# Patient Record
Sex: Female | Born: 1935 | Race: White | Hispanic: No
Health system: Southern US, Community
[De-identification: ages and names within clinical notes are randomized; demographics above are authoritative.]

---

## 2005-06-08 ENCOUNTER — Ambulatory Visit: Payer: Self-pay | Admitting: Family Medicine

## 2005-06-29 ENCOUNTER — Ambulatory Visit: Payer: Self-pay | Admitting: Family Medicine

## 2005-07-15 ENCOUNTER — Ambulatory Visit: Payer: Self-pay | Admitting: Family Medicine

## 2005-08-14 ENCOUNTER — Ambulatory Visit: Payer: Self-pay | Admitting: Family Medicine

## 2005-09-14 ENCOUNTER — Ambulatory Visit: Payer: Self-pay | Admitting: Family Medicine

## 2006-08-22 ENCOUNTER — Ambulatory Visit: Payer: Self-pay | Admitting: Family Medicine

## 2007-09-18 ENCOUNTER — Ambulatory Visit: Payer: Self-pay | Admitting: Family Medicine

## 2010-05-11 ENCOUNTER — Emergency Department: Payer: Self-pay | Admitting: Unknown Physician Specialty

## 2010-05-26 ENCOUNTER — Ambulatory Visit: Payer: Self-pay

## 2010-06-02 ENCOUNTER — Ambulatory Visit: Payer: Self-pay | Admitting: Pain Medicine

## 2010-06-08 ENCOUNTER — Ambulatory Visit: Payer: Self-pay | Admitting: Pain Medicine

## 2010-06-21 ENCOUNTER — Ambulatory Visit: Payer: Self-pay | Admitting: Pain Medicine

## 2012-02-24 ENCOUNTER — Ambulatory Visit: Payer: Self-pay | Admitting: Neurology

## 2012-03-08 ENCOUNTER — Ambulatory Visit: Payer: Self-pay | Admitting: Pain Medicine

## 2012-03-28 ENCOUNTER — Ambulatory Visit: Payer: Self-pay | Admitting: Pain Medicine

## 2012-03-29 ENCOUNTER — Ambulatory Visit: Payer: Self-pay | Admitting: Pain Medicine

## 2012-04-17 ENCOUNTER — Ambulatory Visit: Payer: Self-pay | Admitting: Pain Medicine

## 2013-08-12 ENCOUNTER — Emergency Department: Payer: Self-pay | Admitting: Emergency Medicine

## 2013-08-12 LAB — COMPREHENSIVE METABOLIC PANEL
Alkaline Phosphatase: 984 U/L — ABNORMAL HIGH (ref 50–136)
Anion Gap: 3 — ABNORMAL LOW (ref 7–16)
BUN: 31 mg/dL — ABNORMAL HIGH (ref 7–18)
Bilirubin,Total: 1.6 mg/dL — ABNORMAL HIGH (ref 0.2–1.0)
Calcium, Total: 9 mg/dL (ref 8.5–10.1)
Chloride: 112 mmol/L — ABNORMAL HIGH (ref 98–107)
EGFR (Non-African Amer.): 28 — ABNORMAL LOW
Osmolality: 299 (ref 275–301)
SGPT (ALT): 49 U/L (ref 12–78)
Sodium: 144 mmol/L (ref 136–145)

## 2013-08-12 LAB — CBC
HGB: 13.5 g/dL (ref 12.0–16.0)
MCH: 34.6 pg — ABNORMAL HIGH (ref 26.0–34.0)
MCHC: 32.9 g/dL (ref 32.0–36.0)
Platelet: 160 10*3/uL (ref 150–440)
RDW: 15.8 % — ABNORMAL HIGH (ref 11.5–14.5)

## 2013-08-12 LAB — PRO B NATRIURETIC PEPTIDE: B-Type Natriuretic Peptide: 2603 pg/mL — ABNORMAL HIGH (ref 0–450)

## 2013-08-12 LAB — TROPONIN I: Troponin-I: 0.03 ng/mL

## 2013-09-01 ENCOUNTER — Inpatient Hospital Stay: Payer: Self-pay | Admitting: Internal Medicine

## 2013-09-01 LAB — COMPREHENSIVE METABOLIC PANEL
Albumin: 1.8 g/dL — ABNORMAL LOW (ref 3.4–5.0)
Anion Gap: 5 — ABNORMAL LOW (ref 7–16)
Calcium, Total: 8.1 mg/dL — ABNORMAL LOW (ref 8.5–10.1)
Chloride: 113 mmol/L — ABNORMAL HIGH (ref 98–107)
EGFR (African American): 27 — ABNORMAL LOW
EGFR (Non-African Amer.): 24 — ABNORMAL LOW
Glucose: 206 mg/dL — ABNORMAL HIGH (ref 65–99)
Osmolality: 301 (ref 275–301)
Potassium: 5.5 mmol/L — ABNORMAL HIGH (ref 3.5–5.1)
SGOT(AST): 79 U/L — ABNORMAL HIGH (ref 15–37)
SGPT (ALT): 37 U/L (ref 12–78)
Sodium: 143 mmol/L (ref 136–145)

## 2013-09-01 LAB — CBC WITH DIFFERENTIAL/PLATELET
Basophil #: 0.2 10*3/uL — ABNORMAL HIGH (ref 0.0–0.1)
Eosinophil #: 0.2 10*3/uL (ref 0.0–0.7)
Eosinophil %: 1.4 %
Lymphocyte %: 17.8 %
MCH: 34.9 pg — ABNORMAL HIGH (ref 26.0–34.0)
MCHC: 32.4 g/dL (ref 32.0–36.0)
Monocyte #: 1.5 x10 3/mm — ABNORMAL HIGH (ref 0.2–0.9)
Monocyte %: 11.5 %
Neutrophil #: 9 10*3/uL — ABNORMAL HIGH (ref 1.4–6.5)
Neutrophil %: 67.9 %
Platelet: 154 10*3/uL (ref 150–440)
RBC: 3.16 10*6/uL — ABNORMAL LOW (ref 3.80–5.20)
RDW: 16.5 % — ABNORMAL HIGH (ref 11.5–14.5)
WBC: 13.2 10*3/uL — ABNORMAL HIGH (ref 3.6–11.0)

## 2013-09-01 LAB — CBC
HCT: 26.5 % — ABNORMAL LOW (ref 35.0–47.0)
HGB: 8.6 g/dL — ABNORMAL LOW (ref 12.0–16.0)
MCHC: 32.6 g/dL (ref 32.0–36.0)
MCV: 105 fL — ABNORMAL HIGH (ref 80–100)
Platelet: 116 10*3/uL — ABNORMAL LOW (ref 150–440)
WBC: 13.5 10*3/uL — ABNORMAL HIGH (ref 3.6–11.0)

## 2013-09-01 LAB — PROTIME-INR
INR: 1.2
Prothrombin Time: 15 secs — ABNORMAL HIGH (ref 11.5–14.7)

## 2013-09-01 LAB — APTT
Activated PTT: 32.7 secs (ref 23.6–35.9)
Activated PTT: 39 secs — ABNORMAL HIGH (ref 23.6–35.9)

## 2013-09-02 ENCOUNTER — Ambulatory Visit: Payer: Self-pay | Admitting: Internal Medicine

## 2013-09-02 LAB — HEMOGLOBIN
HGB: 8.9 g/dL — ABNORMAL LOW (ref 12.0–16.0)
HGB: 9 g/dL — ABNORMAL LOW (ref 12.0–16.0)

## 2013-09-02 LAB — COMPREHENSIVE METABOLIC PANEL
Albumin: 1.9 g/dL — ABNORMAL LOW (ref 3.4–5.0)
Anion Gap: 4 — ABNORMAL LOW (ref 7–16)
BUN: 47 mg/dL — ABNORMAL HIGH (ref 7–18)
Calcium, Total: 7.4 mg/dL — ABNORMAL LOW (ref 8.5–10.1)
Co2: 27 mmol/L (ref 21–32)
EGFR (African American): 27 — ABNORMAL LOW
EGFR (Non-African Amer.): 23 — ABNORMAL LOW
Glucose: 193 mg/dL — ABNORMAL HIGH (ref 65–99)
Osmolality: 310 (ref 275–301)
Potassium: 5.3 mmol/L — ABNORMAL HIGH (ref 3.5–5.1)
SGPT (ALT): 27 U/L (ref 12–78)
Sodium: 147 mmol/L — ABNORMAL HIGH (ref 136–145)
Total Protein: 4.9 g/dL — ABNORMAL LOW (ref 6.4–8.2)

## 2013-09-02 LAB — CBC WITH DIFFERENTIAL/PLATELET
Basophil %: 0.4 %
HCT: 30 % — ABNORMAL LOW (ref 35.0–47.0)
MCH: 33.4 pg (ref 26.0–34.0)
MCHC: 33.7 g/dL (ref 32.0–36.0)
MCV: 99 fL (ref 80–100)
Monocyte #: 1.7 x10 3/mm — ABNORMAL HIGH (ref 0.2–0.9)
Monocyte %: 7.3 %
Neutrophil %: 85.1 %
Platelet: 111 10*3/uL — ABNORMAL LOW (ref 150–440)
RBC: 3.03 10*6/uL — ABNORMAL LOW (ref 3.80–5.20)
RDW: 18.1 % — ABNORMAL HIGH (ref 11.5–14.5)
WBC: 23.5 10*3/uL — ABNORMAL HIGH (ref 3.6–11.0)

## 2013-09-02 LAB — PROTIME-INR: Prothrombin Time: 15.7 secs — ABNORMAL HIGH (ref 11.5–14.7)

## 2013-09-03 LAB — PROTIME-INR
INR: 1.1
Prothrombin Time: 13.9 secs (ref 11.5–14.7)

## 2013-09-03 LAB — CBC WITH DIFFERENTIAL/PLATELET
Basophil #: 0.1 10*3/uL (ref 0.0–0.1)
Basophil #: 0.1 10*3/uL (ref 0.0–0.1)
Basophil %: 0.7 %
Basophil %: 0.8 %
Eosinophil #: 0.3 10*3/uL (ref 0.0–0.7)
Eosinophil #: 0.3 10*3/uL (ref 0.0–0.7)
Eosinophil %: 1.9 %
HGB: 8.3 g/dL — ABNORMAL LOW (ref 12.0–16.0)
HGB: 8.3 g/dL — ABNORMAL LOW (ref 12.0–16.0)
Lymphocyte #: 2.4 10*3/uL (ref 1.0–3.6)
Lymphocyte #: 1.8 10*3/uL (ref 1.0–3.6)
Lymphocyte %: 14.2 %
MCH: 34.2 pg — ABNORMAL HIGH (ref 26.0–34.0)
MCV: 100 fL (ref 80–100)
MCV: 100 fL (ref 80–100)
Monocyte %: 9.5 %
Neutrophil #: 12.1 10*3/uL — ABNORMAL HIGH (ref 1.4–6.5)
Neutrophil #: 12 10*3/uL — ABNORMAL HIGH (ref 1.4–6.5)
Neutrophil %: 71.6 %
Neutrophil %: 76.3 %
RBC: 2.46 10*6/uL — ABNORMAL LOW (ref 3.80–5.20)
RDW: 18.4 % — ABNORMAL HIGH (ref 11.5–14.5)

## 2013-09-03 LAB — BASIC METABOLIC PANEL
Anion Gap: 6 — ABNORMAL LOW (ref 7–16)
EGFR (African American): 24 — ABNORMAL LOW
EGFR (Non-African Amer.): 21 — ABNORMAL LOW
Glucose: 182 mg/dL — ABNORMAL HIGH (ref 65–99)

## 2013-09-04 LAB — BASIC METABOLIC PANEL
BUN: 54 mg/dL — ABNORMAL HIGH (ref 7–18)
Chloride: 111 mmol/L — ABNORMAL HIGH (ref 98–107)
Co2: 22 mmol/L (ref 21–32)
Creatinine: 2.04 mg/dL — ABNORMAL HIGH (ref 0.60–1.30)
EGFR (Non-African Amer.): 21 — ABNORMAL LOW
Osmolality: 299 (ref 275–301)
Potassium: 4.5 mmol/L (ref 3.5–5.1)
Sodium: 141 mmol/L (ref 136–145)

## 2013-09-04 LAB — AMMONIA: Ammonia, Plasma: 205 mcmol/L — ABNORMAL HIGH (ref 11–32)

## 2013-09-04 LAB — CBC WITH DIFFERENTIAL/PLATELET
Basophil #: 0.2 10*3/uL — ABNORMAL HIGH (ref 0.0–0.1)
Eosinophil %: 2.2 %
HGB: 8.1 g/dL — ABNORMAL LOW (ref 12.0–16.0)
Lymphocyte #: 1.8 10*3/uL (ref 1.0–3.6)
Lymphocyte %: 11.8 %
MCH: 34.4 pg — ABNORMAL HIGH (ref 26.0–34.0)
RDW: 18.4 % — ABNORMAL HIGH (ref 11.5–14.5)
WBC: 15.5 10*3/uL — ABNORMAL HIGH (ref 3.6–11.0)

## 2013-09-14 ENCOUNTER — Ambulatory Visit: Payer: Self-pay | Admitting: Internal Medicine

## 2013-09-14 DEATH — deceased

## 2014-01-04 IMAGING — CT CT ABD-PELV W/O CM
1 of 4 series · 12 of 32 positions shown, 18 images · non-contrast
Comparison: None

REASON FOR EXAM: (1) foley not draining urine even after foley was
changed out. Patient retaining
COMMENTS:

PROCEDURE:     CT  - CT ABDOMEN AND PELVIS W[DATE]  [DATE]
RESULT:     Indication: Foley not draining urine
TECHNIQUE: Multiple axial images from the lung bases to the symphysis pubis
were obtained without oral and without intravenous contrast.  Following the
initial noncontrast examination 30 mL of Cystografin contrast was gravity
infused into the bladder through the existing Foley catheter.

[Series 2: 3mm soft tissue · axial · 0.76mm/px · z∈[-594,-122]mm · 12 of 185 slices shown, 18 images]
[im 14/185  soft-tissue]
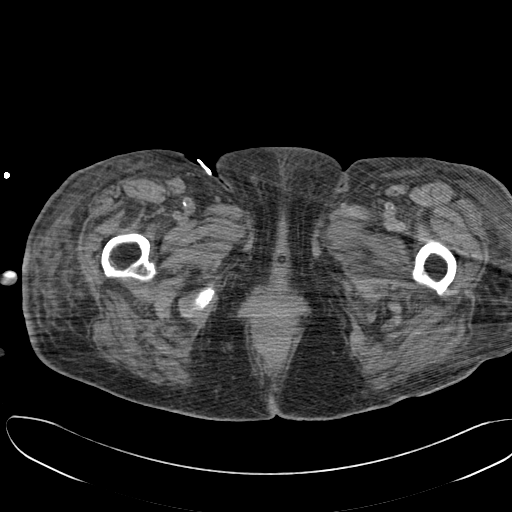
[im 14/185  bone]
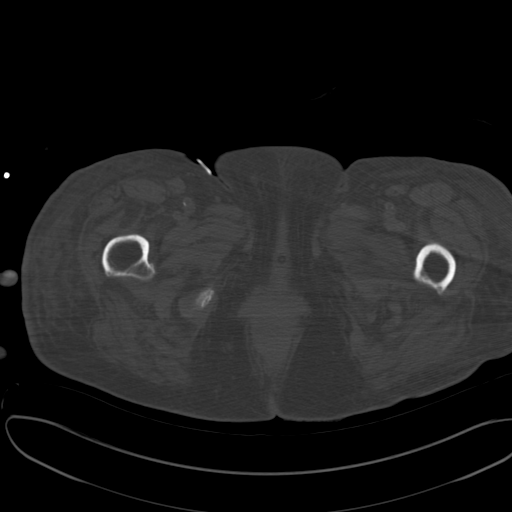
[im 27/185  soft-tissue]
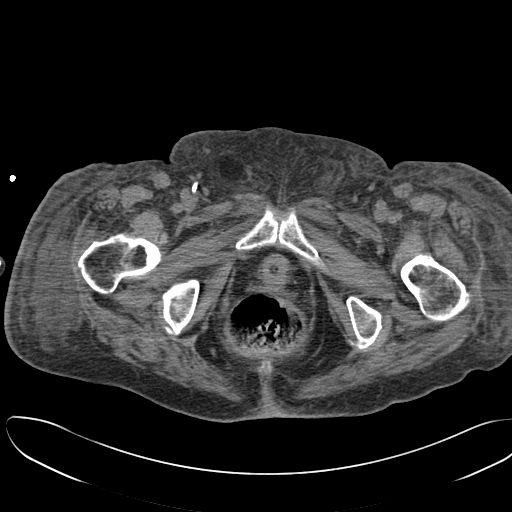
[im 40/185  soft-tissue]
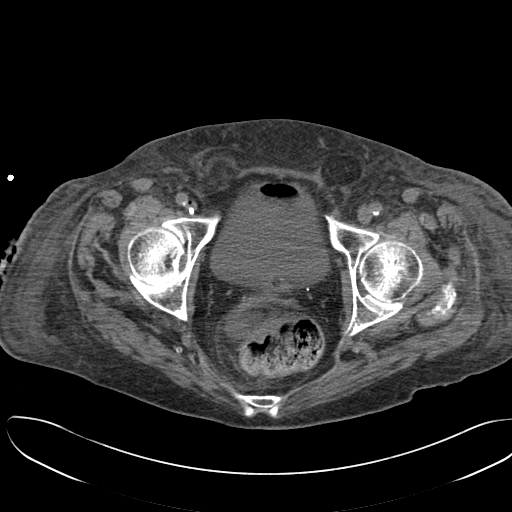
[im 53/185  soft-tissue]
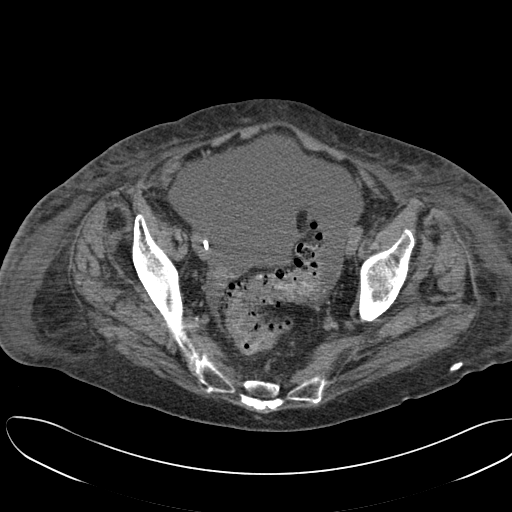
[im 66/185  soft-tissue]
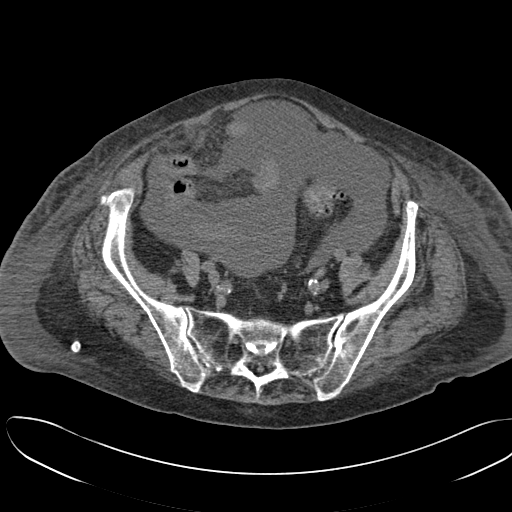
[im 79/185  soft-tissue]
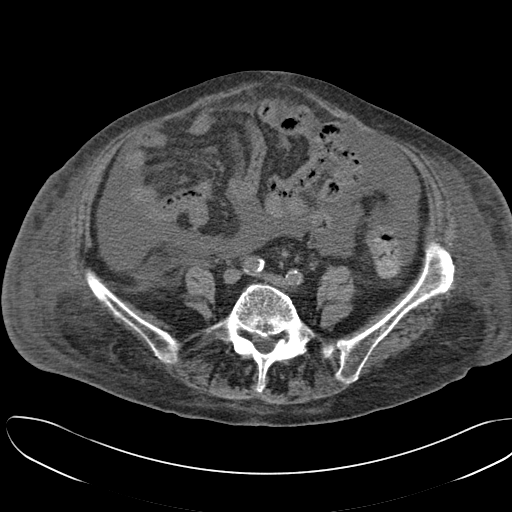
[im 106/185  soft-tissue]
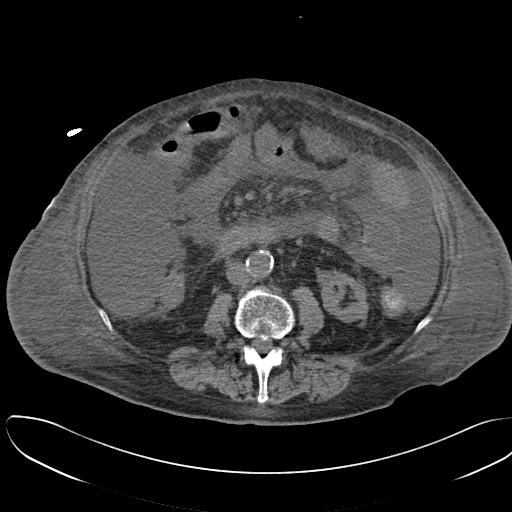
[im 119/185  soft-tissue]
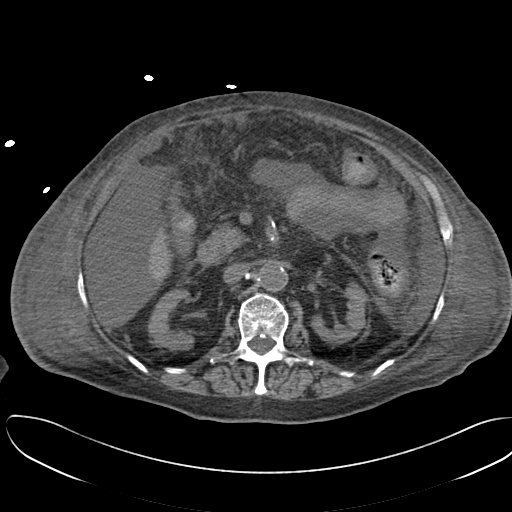
[im 132/185  soft-tissue]
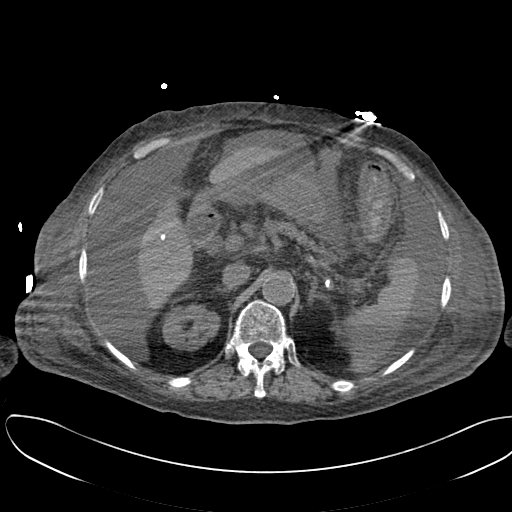
[im 132/185  lung]
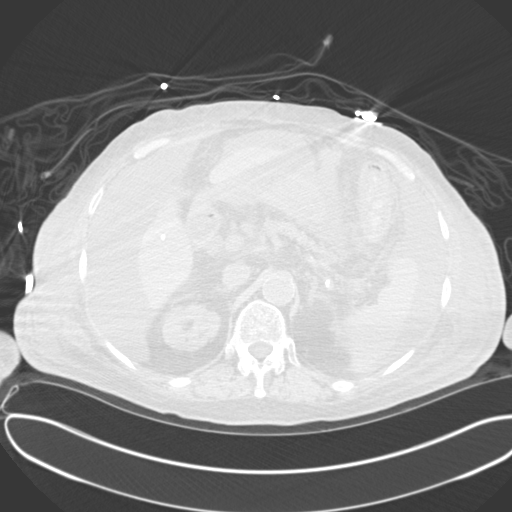
[im 132/185  bone]
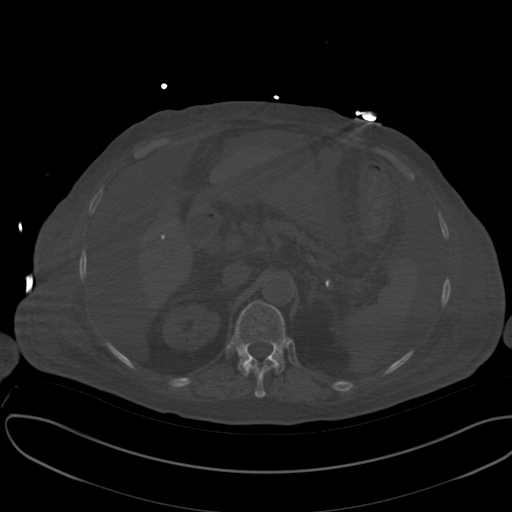
[im 145/185  soft-tissue]
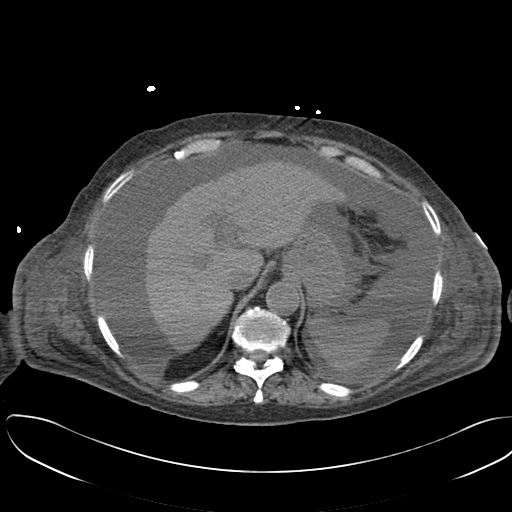
[im 145/185  lung]
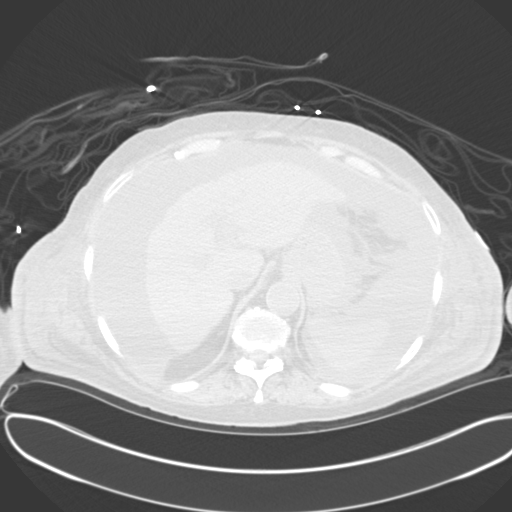
[im 158/185  soft-tissue]
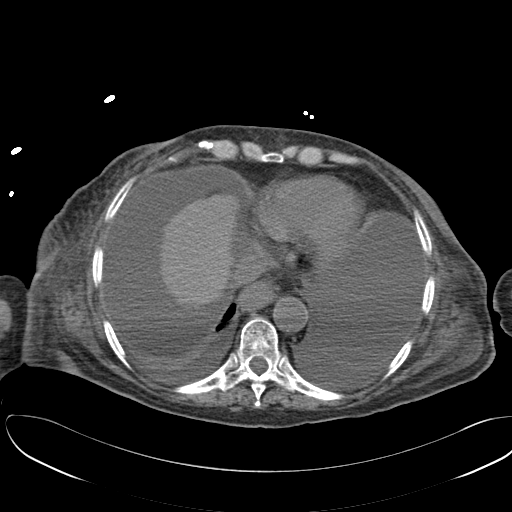
[im 158/185  lung]
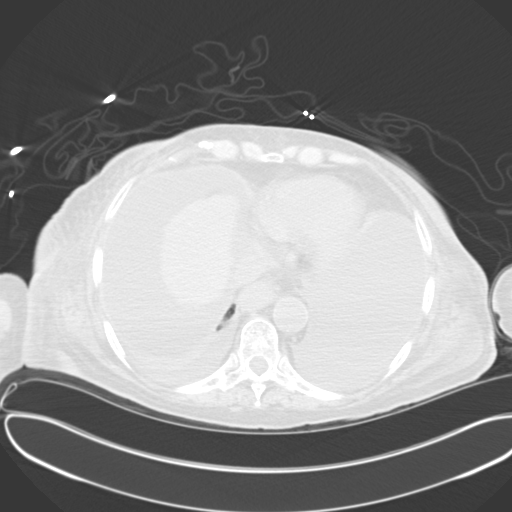
[im 171/185  soft-tissue]
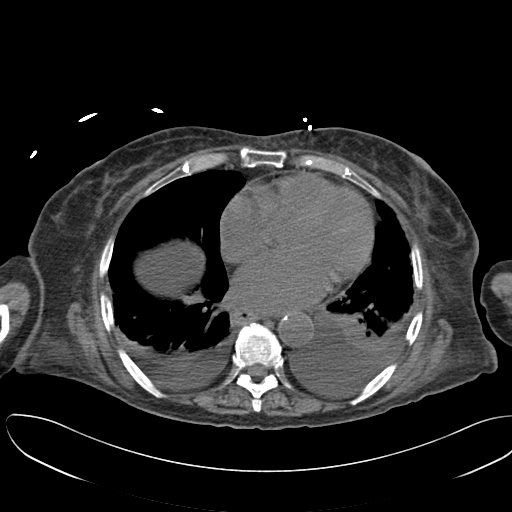
[im 171/185  lung]
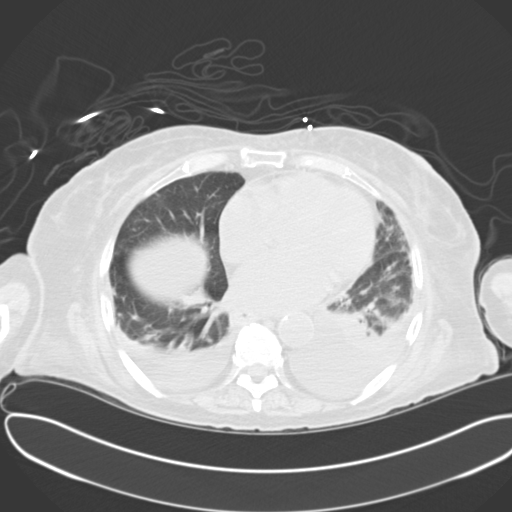

[12 of 32 positions shown; findings below may reference images not displayed]

FINDINGS: Bilateral small pleural effusions, left greater than right with associated
compressive atelectasis.

No renal, ureteral, or bladder calculi. No obstructive uropathy. No
perinephric stranding is seen. The kidneys are symmetric in size without
evidence for exophytic mass. There is a Foley catheter with the balloon of
the Foley catheter within the bladder. The bladder is relatively
decompressed. There is air present within the bladder secondary to the
presence of a Foley catheter.

The liver is diminutive in size with a micronodular contour consistent with
cirrhosis. There is a large amount of abdominal ascites. The gallbladder is
unremarkable. The spleen demonstrates no focal abnormality. The adrenal
glands and pancreas are normal.

The unopacified stomach, duodenum, small intestine, and large intestine are
unremarkable, but evaluation is limited by lack of oral contrast. There is
diverticulosis without evidence of diverticulitis. There is no
pneumoperitoneum, pneumatosis, or portal venous gas. There is no abdominal
or pelvic free fluid. There is no lymphadenopathy.

The abdominal aorta is normal in caliber with atherosclerosis.

The osseous structures are unremarkable.
IMPRESSION: 1. Foley catheter present within the bladder.
2. Bilateral small pleural effusions, left greater than right.
3. Large amount of abdominal ascites.
4. Cirrhotic liver.

[REDACTED]

## 2014-01-04 IMAGING — US ABDOMEN ULTRASOUND
1 series · 13 of 25 positions shown · non-contrast
Comparison: none

REASON FOR EXAM: elevated LFT, PT and INR
COMMENTS:

[Series 1: abdomen ultrasound · 0.28mm/px · 13 of 97 slices shown]
[im 1/97]
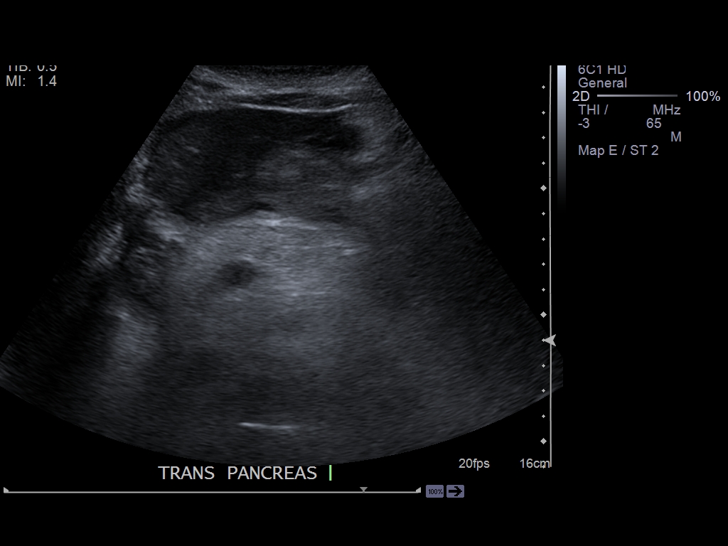
[im 9/97]
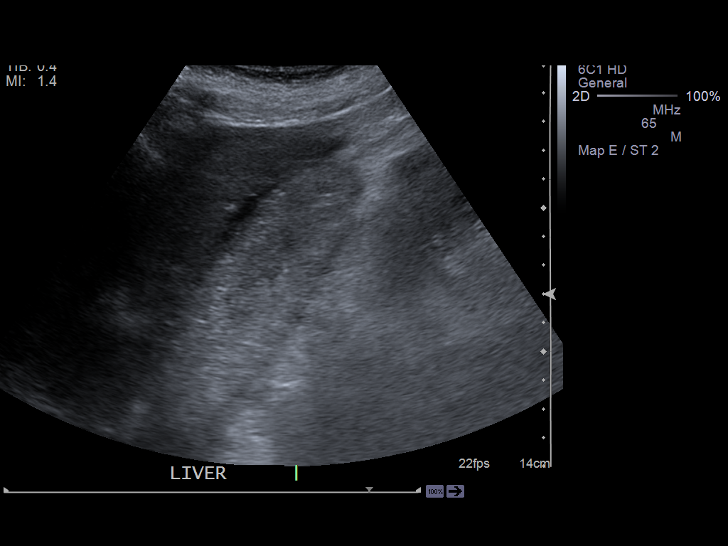
[im 17/97]
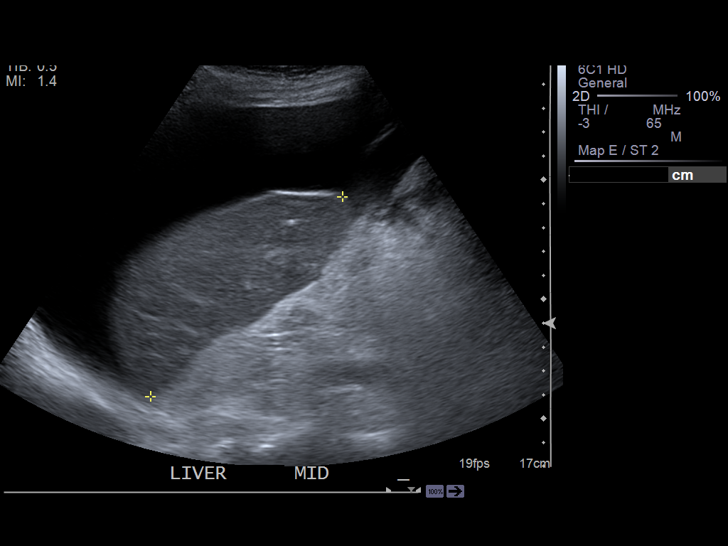
[im 25/97]
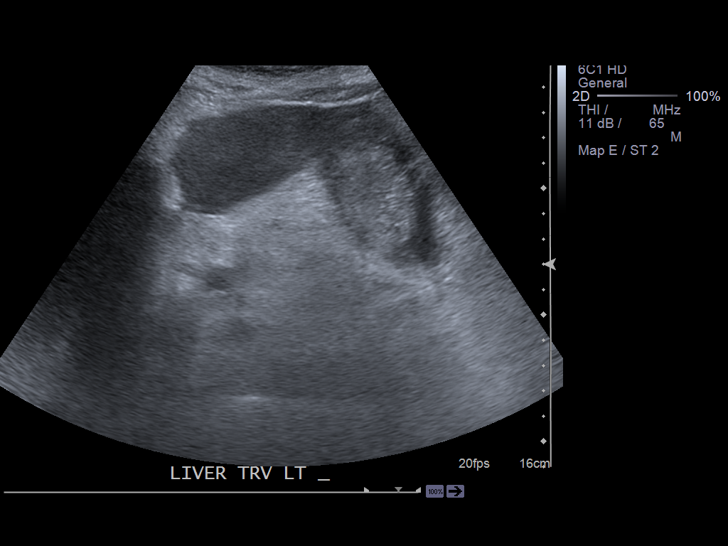
[im 33/97]
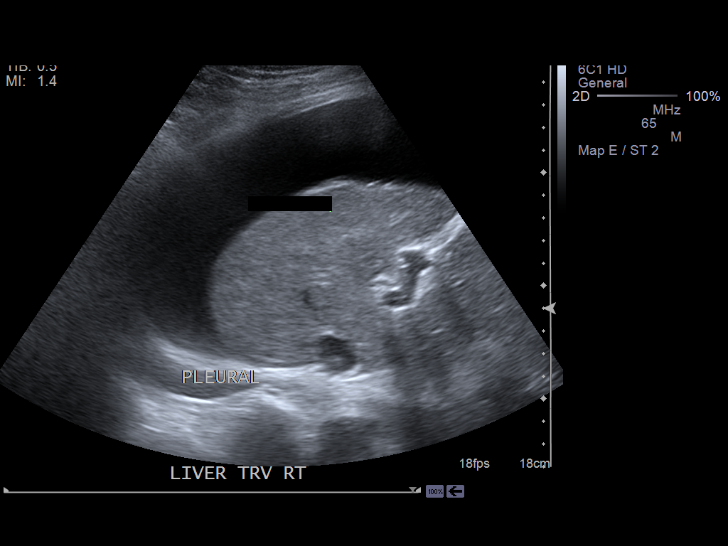
[im 41/97]
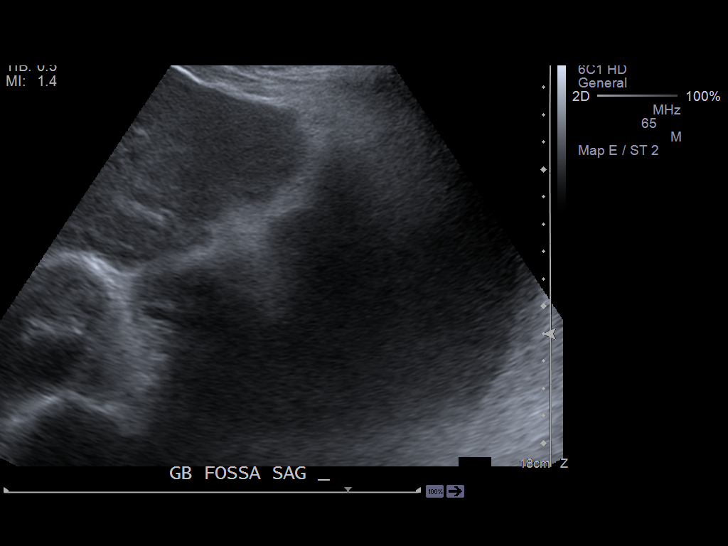
[im 49/97]
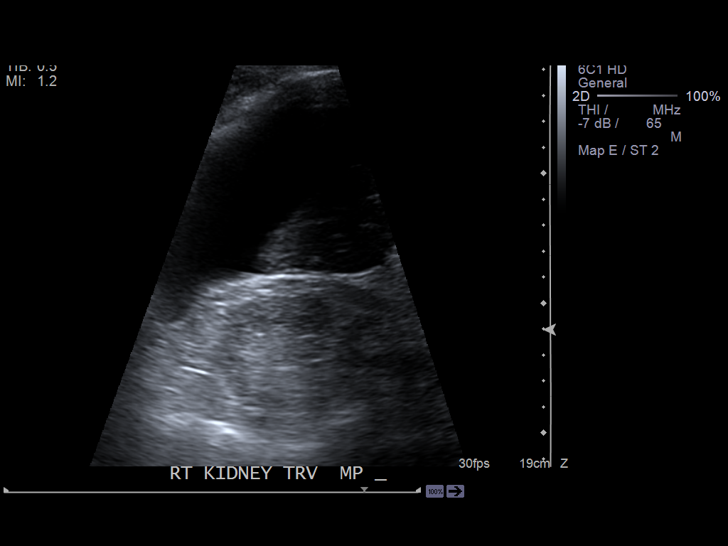
[im 57/97]
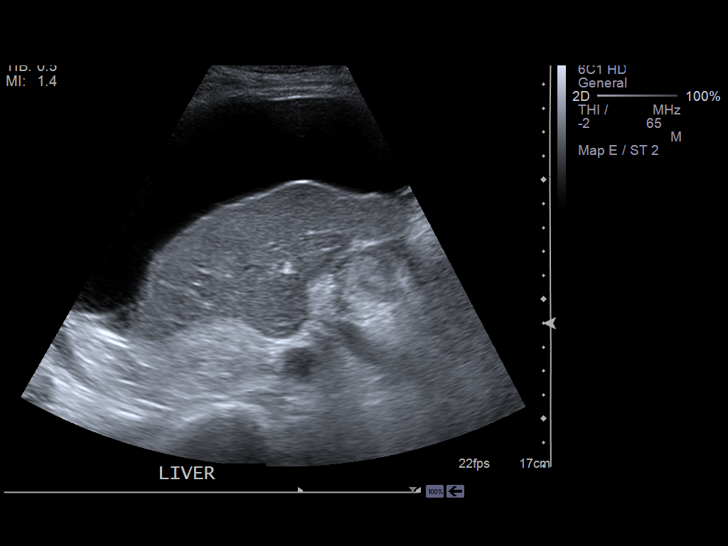
[im 65/97]
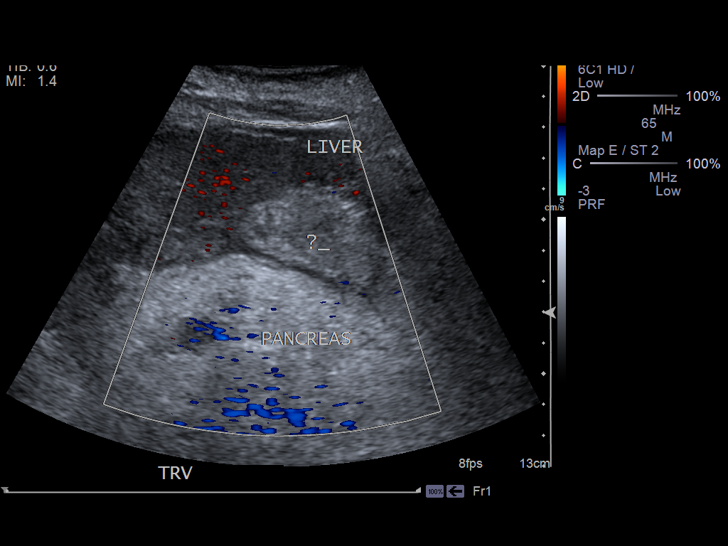
[im 73/97]
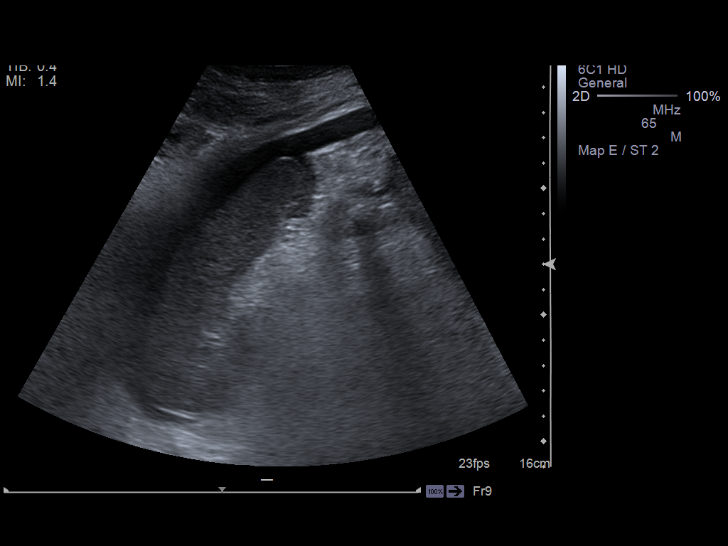
[im 81/97]
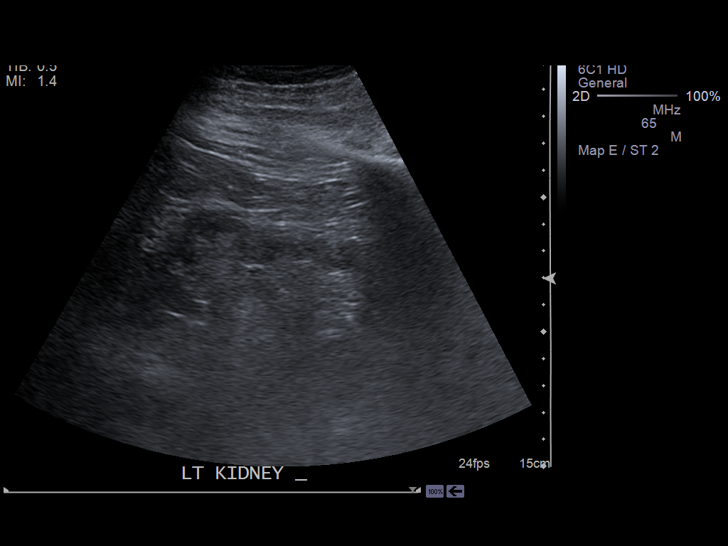
[im 89/97]
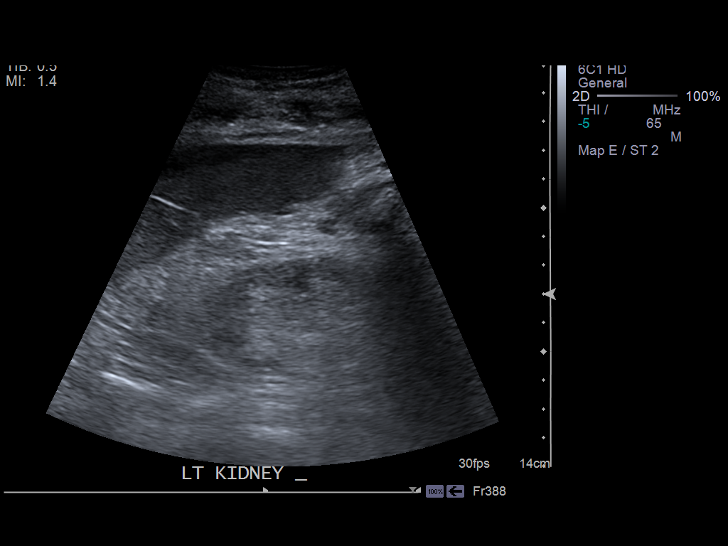
[im 97/97]
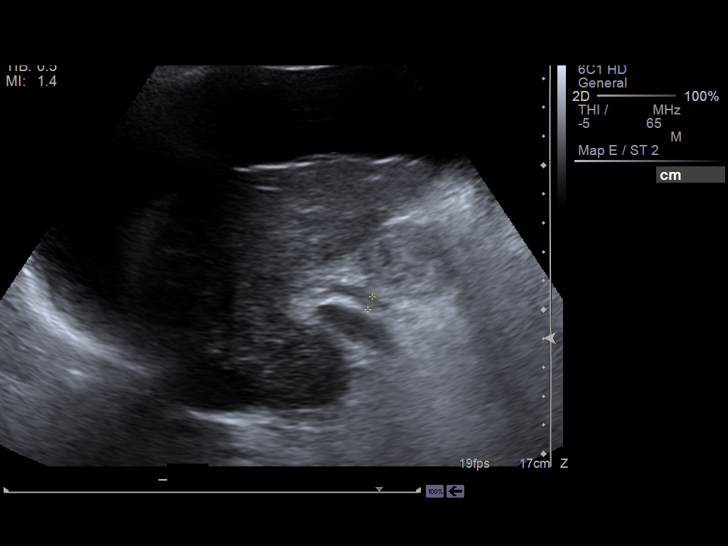

[13 of 25 positions shown; findings below may reference images not displayed]

PROCEDURE:     US  - US ABDOMEN GENERAL SURVEY  - September 03, 2013 [DATE]

RESULT:     Ultrasound of the abdomen abdominal ultrasound performed
portably in the critical care unit shows the presence of ascites and
bilateral pleural effusions. The liver appears to be somewhat small with a
length of 11.60 cm and nodular margins with a heterogeneous echotexture. Is
there history of liver disease such as cirrhosis? Portal venous flow appears
normal the common bile duct diameter is 4.7 mm. The right kidney shows
echogenic appearance of the parenchyma measuring 8.99 x 4.63 x 4.17 cm.
Between the liver the pancreas there is an uncertain structure which could
represent stomach although peristalsis was not demonstrated. This is
somewhat hypoechoic to the pancreas and hyperechoic to the liver. The spleen
measures 8.43 x 3.49 x 9.5 to centimeters. The left kidney measures 9.33 x
3.72 x 3.95 cm. Neither kidney shows a solid or cystic mass or obstruction.
IMPRESSION: 1. Nodular small liver. Correlate for cirrhosis.
2. Increased echotexture of the renal parenchyma which can be consistent
with chronic renal disease.
3. Ascites with pleural effusions bilaterally.
4. Poor visualization of the pancreas.
5. Nonspecific echotexture between the liver and pancreas which could
represent an abnormal stomach or a collapsed nonfluid filled and
nonperistalsing stomach. There is partial visualization of the aorta. The
portal venous flow an inferior vena cava showed patency.

[REDACTED]

## 2015-03-06 NOTE — Discharge Summary (Signed)
PATIENT NAME:  Sue Lynch, Sue Lynch MR#:  [id number] DATE OF BIRTH:  [birth year]  DATE OF ADMISSION:  09/01/2013 DATE OF DISCHARGE TO HOSPICE HOME:  09/05/2013  PRESENTING COMPLAINT:  Severe hematemesis.   DISCHARGE DIAGNOSES: 1.  Acute posthemorrhagic anemia due to massive upper gastrointestinal  bleed, status post 2 units blood transfusion and 2 units fresh frozen plasma.  2.  Acute hypovolemic shock due to gastrointestinal bleed.  3.  Advanced cirrhosis of liver with massive ascites as noted on CT abdomen, new diagnosis.  4.  Acute on chronic kidney disease.  5.  Type 2 diabetes.  6.  Hypothyroidism.  7.  Dementia.   CODE STATUS: NO CODE, DO NOT RESUSCITATE.   MEDICATIONS: 1.  Roxanol 20 mg/mL 0.25 mL every 1 to 2 hours.  2.  Lorazepam 0.5 mg 1 tablet every 2 to 4 hours.  3.  Zofran ODT 4 mg every 6 hours as needed.   OXYGEN: Two L/min, continuous.   Leave Foley catheter in to prevent skin breakdown.   Hemoglobin is 8.1.     CONSULTATIONS:   1.  GI, Dr. Donnella Sham. 2.  Palliative, Dr. Ermalinda Memos.   BRIEF SUMMARY OF HOSPITAL COURSE: Ms. Vessey is a pleasant 81 year old Caucasian female who has history of Parkinson disease, dementia, hypothyroidism and history of hepatitis in the remote past, comes in with:  1.  Acute posthemorrhagic anemia, which was due to passive upper GI bleed. The patient vomited large amounts of bright red blood. Came in with hemoglobin of 11, dropped down to 8.3. Got 2 units of blood transfusion with 2 units of FFP. Hemoglobin was 8.1. She was started on IV octreotide drip after it was noted that her liver shows signs of cirrhosis on ultrasound, along with abnormal coagulopathies. The patient was also started on IV Protonix drip. GI  consultation was made with Dr. Gustavo Lah, who performed endoscopy, which showed esophageal  varices, and the patient underwent banding of the varices. The patient had a  large amount of blood in her stomach, which is retained. She did not  have any further episodes of hematemesis; however, she continued to have dark maroon stools.  2.  Acute hypovolemic shock due to GI bleed, resuscitated with IV fluids, blood products and FFP. The patient's blood pressure was stabilized. Her Levophed was discontinued then.  3.  Advanced cirrhosis of liver with abnormal PT/INR, thrombocytopenia and elevated transaminases in the setting of history of hepatitis in the remote past. The patient was found to have massive ascites on abdominal CT scan, along with shrunken/cirrhotic liver on ultrasound of the abdomen. This is a new diagnosis. She also was found to have ammonia of 208. She remained lethargic and had symptoms of hepatic encephalopathy. Her prognosis is poor. Palliative care was consulted, met with the family, and given her overall decline over the past few months, daughter, Annett Gula, who is the patient's DPOA, requested comfort measures and was agreeable with hospice home transfer. The patient will be transferred to hospice home later today.     TIME SPENT: 40 minutes.   ____________________________ Hart Rochester Posey Pronto, MD sap:dmm D: 09/05/2013 15:53:40 ET T: 09/05/2013 20:18:34 ET JOB#: [id number]  cc: Danzig Macgregor A. Posey Pronto, MD, <Dictator> Ilda Basset MD ELECTRONICALLY SIGNED 09/08/2013 10:57

## 2015-03-06 NOTE — Op Note (Signed)
PATIENT NAME:  Sue Lynch, Sue Lynch MR#:  [id number] DATE OF BIRTH:  10-03-1922  DATE OF PROCEDURE:  09/01/2013  PREOPERATIVE DIAGNOSIS: Gastrointestinal bleed.   POSTOPERATIVE DIAGNOSIS: Gastrointestinal bleed.   PROCEDURE: Right femoral vein central venous catheter placement.   SURGEON: Leoncio Hansen E. Burt Knack, M.D.   ANESTHESIA: Local anesthetic.   INDICATIONS: This is a demented patient who is actively GI bleeding and vomiting blood in the Emergency Room. She is unstable and requires emergent placement of a central line. Consent has been obtained from the daughter.   The right femoral vein was chosen due to inability to place the patient in a supine position without a high risk of aspiration while she is actively vomiting blood. Therefore, femoral site was chosen.   DESCRIPTION OF PROCEDURE: The patient was identified in the ER, prepped and draped in a sterile fashion. All in attendance were in caps and masks, and I wore a gown as well. She was prepped and draped in a sterile fashion and then palpation of the right femoral artery identified the area for placement into the right femoral vein. On the first attempt, a large-bore needle was placed followed by a Seldinger wire. An incision was made. The introducer dilator was placed followed by placement of the previously flushed triple-lumen catheter which was sutured to the anterior thigh skin with 3-0 silk which were provided in the kit, and a sterile dressing was placed. Each port was aspirated and flushed.   The patient tolerated this procedure well. There were no complications. She was left in critical condition in the Emergency Room.    ____________________________ Jerrol Banana Burt Knack, MD rec:gb D: 09/01/2013 20:56:29 ET T: 09/02/2013 01:28:08 ET JOB#: [id number]  cc: Jerrol Banana. Burt Knack, MD, <Dictator> Florene Glen MD ELECTRONICALLY SIGNED 09/02/2013 6:43

## 2015-03-06 NOTE — Consult Note (Signed)
Chief Complaint:  Subjective/Chief Complaint seen for GI bleed, stable overnight, 2 small unformed stools overnight, maroon.  mild abdominal discomfort, no nausea. sleepy   Brief Assessment:  Cardiac Regular   Respiratory clear BS   Gastrointestinal details normal Soft  Nontender  Bowel sounds normal  mild distension   Lab Results: Routine Chem:  22-Oct-14 03:40   Glucose, Serum  153  BUN  54  Creatinine (comp)  2.04  Sodium, Serum 141  Potassium, Serum 4.5  Chloride, Serum  111  CO2, Serum 22  Calcium (Total), Serum  7.7  Anion Gap 8  Osmolality (calc) 299  eGFR (African American)  24  eGFR (Non-African American)  21 (eGFR values <56m/min/1.73 m2 may be an indication of chronic kidney disease (CKD). Calculated eGFR is useful in patients with stable renal function. The eGFR calculation will not be reliable in acutely ill patients when serum creatinine is changing rapidly. It is not useful in  patients on dialysis. The eGFR calculation may not be applicable to patients at the low and high extremes of body sizes, pregnant women, and vegetarians.)  Routine Coag:  21-Oct-14 15:40   INR 1.1 (INR reference interval applies to patients on anticoagulant therapy. A single INR therapeutic range for coumarins is not optimal for all indications; however, the suggested range for most indications is 2.0 - 3.0. Exceptions to the INR Reference Range may include: Prosthetic heart valves, acute myocardial infarction, prevention of myocardial infarction, and combinations of aspirin and anticoagulant. The need for a higher or lower target INR must be assessed individually. Reference: The Pharmacology and Management of the Vitamin K  antagonists: the seventh ACCP Conference on Antithrombotic and Thrombolytic Therapy. CGBEEF.0071Sept:126 (3suppl): [id number] A HCT value >55% may artifactually increase the PT.  In one study,  the increase was an average of 25%. Reference:  "Effect on  Routine and Special Coagulation Testing Values of Citrate Anticoagulant Adjustment in Patients with High HCT Values." American Journal of Clinical Pathology 2006;126:400-405.)  Routine Hem:  19-Oct-14 17:28   Hemoglobin (CBC)  11.0  Platelet Count (CBC) 154    20:43   Hemoglobin (CBC)  8.6  Platelet Count (CBC)  116 (Result(s) reported on 01 Sep 2013 at 08:56PM.)  20-Oct-14 03:09   Hemoglobin (CBC)  10.1  Platelet Count (CBC)  111    13:18   Hemoglobin (CBC)  8.9 (Result(s) reported on 02 Sep 2013 at 02:04PM.)    20:03   Hemoglobin (CBC)  9.0 (Result(s) reported on 02 Sep 2013 at 08:44PM.)  21-Oct-14 04:00   Hemoglobin (CBC)  8.3  Platelet Count (CBC)  84    15:40   Hemoglobin (CBC)  8.3  Platelet Count (CBC)  94  22-Oct-14 03:40   WBC (CBC)  15.5  RBC (CBC)  2.35  Hemoglobin (CBC)  8.1  Hematocrit (CBC)  23.8  Platelet Count (CBC)  107  MCV  101  MCH  34.4  MCHC 34.1  RDW  18.4  Neutrophil % 74.9  Lymphocyte % 11.8  Monocyte % 10.0  Eosinophil % 2.2  Basophil % 1.1  Neutrophil #  11.6  Lymphocyte # 1.8  Monocyte #  1.6  Eosinophil # 0.3  Basophil #  0.2 (Result(s) reported on 04 Sep 2013 at 04:31AM.)   Radiology Results: CT:    21-Oct-14 16:58, CT Abdomen and Pelvis Without Contrast  CT Abdomen and Pelvis Without Contrast   REASON FOR EXAM:    (1) foley not draining urine even after foley was  changed out. Patient retaining  COMMENTS:       PROCEDURE: CT  - CT ABDOMEN AND PELVIS W0  - Sep 03 2013  4:58PM     RESULT: Indication: Foley not draining urine    Comparison: None    Technique: Multiple axial images from the lung bases to the symphysis   pubis were obtained without oral and without intravenous contrast.    Following the initial noncontrast examination 30 mL of Cystografin   contrast was gravity infused intothe bladder through the existing Foley   catheter.   Findings:    Bilateral small pleural effusions, left greater than right with    associated compressive atelectasis.    No renal, ureteral, or bladder calculi. No obstructive uropathy. No   perinephric stranding is seen. The kidneys are symmetric in size without   evidence for exophytic mass. There is a Foley catheter with the balloon   of the Foley catheter within the bladder. The bladder is relatively   decompressed. There is air present withinthe bladder secondary to the   presence of a Foley catheter.    The liver is diminutive in size with a micronodular contour consistent   with cirrhosis. There is a large amount of abdominal ascites. The   gallbladder is unremarkable. The spleen demonstrates no focal     abnormality. The adrenal glands and pancreas are normal.     The unopacified stomach, duodenum, small intestine, and large intestine   are unremarkable, but evaluation is limited by lack of oral contrast.   There is diverticulosiswithout evidence of diverticulitis. There is no   pneumoperitoneum, pneumatosis, or portal venous gas. There is no   abdominal or pelvic free fluid. There is no lymphadenopathy.     The abdominal aorta is normal in caliber with atherosclerosis.    The osseous structures are unremarkable.    IMPRESSION:     1. Foley catheter present within the bladder.  2. Bilateral small pleural effusions, left greater than right.  3. Large amount of abdominal ascites.  4. Cirrhotic liver.    Dictation Site: 1        Verified By: Jennette Banker, M.D., MD   Assessment/Plan:  Assessment/Plan:  Assessment 1) GI bleeding.  with new diagnosis of cirrhosis, likely portal type etiology.  placed on octreotide yesterday. continueing on iv ppi.  ascites noted.  with some sleepiness and lethargy this am will need to check ammonia level and give sbp prophylaxis.   Plan 1) as above, egd this am.  I nhave discussed the risks benefits and com,plications of egd to include not limited to bleeding infection perforation and sedation and she and family  wish to proceed.  further recs to follow.   Electronic Signatures: Loistine Simas (MD)  (Signed 22-Oct-14 07:24)  Authored: Chief Complaint, Brief Assessment, Lab Results, Radiology Results, Assessment/Plan   Last Updated: 22-Oct-14 07:24 by Loistine Simas (MD)

## 2015-03-06 NOTE — Consult Note (Signed)
Chief Complaint:  Subjective/Chief Complaint CT results noted, confirmatory of cirrhotic liver and abdominal distension due to ascites.  Over the course of the day, hgb has been stable, with PT and plt counts both stable/improving.  Hemodynamically stable.  Will proceed with egd in am.  I have discussed the risks benefits and complications of egd to include not limited to bleeding infection perforation and sedation and she and family wish to proceed.  continue serial hgb, transfuse as needed, octreotide drip started, continue protonix drip.  I have discussed the risks, benefits and complications to include not limited to bleeding infection perforation and sedation with patient and her daughter and she wishes to proceed.   VITAL SIGNS/ANCILLARY NOTES: **Vital Signs.:   21-Oct-14 19:30  Vital Signs Type Routine  Temperature Temperature (F) 97.9  Celsius 36.6  Temperature Source axillary  Pulse Pulse 88  Respirations Respirations 13  Systolic BP Systolic BP 140  Diastolic BP (mmHg) Diastolic BP (mmHg) 61  Mean BP 87  Oxygen Delivery 2L  Pulse Ox Heart Rate 92   Electronic Signatures: Barnetta ChapelSkulskie, Martin (MD)  (Signed 21-Oct-14 20:41)  Authored: Chief Complaint, VITAL SIGNS/ANCILLARY NOTES   Last Updated: 21-Oct-14 20:41 by Barnetta ChapelSkulskie, Martin (MD)

## 2015-03-06 NOTE — Consult Note (Signed)
Chief Complaint:  Subjective/Chief Complaint patient seen for GI bleed.  stable through this am, off pressor.  several bloody/maroon stools today. patietn denies abdominal pain or nausea, no emesis.   VITAL SIGNS/ANCILLARY NOTES: **Vital Signs.:   21-Oct-14 15:00  Vital Signs Type Routine  Temperature Source axillary  Pulse Pulse 70  Pulse source if not from Vital Sign Device per cardiac monitor  Respirations Respirations 15  Systolic BP Systolic BP 130  Diastolic BP (mmHg) Diastolic BP (mmHg) 63  Mean BP 88  Pulse Ox % Pulse Ox % 99  Pulse Ox Activity Level  At rest  Oxygen Delivery 2L  Pulse Ox Heart Rate 72   Brief Assessment:  Cardiac Regular   Respiratory clear BS   Gastrointestinal details normal Bowel sounds normal  No rebound tenderness  mild distension   Additional Physical Exam dre, dark maroon/bloody   Lab Results: General Ref:  20-Oct-14 20:03   Hepatitis B Core Ab, IgG/IgM ========== TEST NAME ==========  ========= RESULTS =========  = REFERENCE RANGE =  Hepa.B Core Ab, IgG,IgM  HBV Core Ab, IgG/IgM Diff Hep B Core Ab, IgM              [   Negative             ]          Negative Hep B Core Ab, Tot              [  Negative             ]          Negative               [facility] [place]            No: [id number]           [address], [place], [place] [address]           Lindon Romp, MD         [phone]   Result(s) reported on 03 Sep 2013 at 09:48AM.  Hepatitis C Virus Antibody ========== TEST NAME ==========  ========= RESULTS =========  = REFERENCE RANGE =  HCV ANTIBODY  HCV Antibody Hep C Virus Ab                  [   <0.1 s/co ratio      ]           0.0-0.9                                                  Negative:     < 0.8                                             Indeterminate: 0.8 - 0.9                                                  Positive:     > 0.9  .  In order to reduce the incidence of a false positive                  result, the CDC recommends that all s/co ratios                  between 1.0 and 10.9 be confirmed by a more specific                  supplemental or PCR testing. [facility] offers HCV Ab                 w/Reflex to Verification test [phone].               [facility]            No: [id number]           [address] [place], [place] [address]           Lindon Romp, MD         [phone]   Result(s) reportedon 03 Sep 2013 at 09:48AM.  Routine Chem:  21-Oct-14 04:00   Glucose, Serum  182  BUN  54  Creatinine (comp)  2.05  Sodium, Serum 143  Potassium, Serum 4.6  Chloride, Serum  113  CO2, Serum 24  Calcium (Total), Serum  7.5  Anion Gap  6  Osmolality (calc) 304  eGFR (African American)  24  eGFR (Non-African American)  21 (eGFR values <20m/min/1.73 m2 may be an indication of chronic kidney disease (CKD). Calculated eGFR is useful in patients with stable renal function. The eGFR calculation will not be reliable in acutely ill patients when serum creatinine is changing rapidly. It is not useful in  patients on dialysis. The eGFR calculation may not be applicable to patients at the low and high extremes of body sizes, pregnant women, and vegetarians.)  Routine Hem:  21-Oct-14 04:00   WBC (CBC)  16.9  RBC (CBC)  2.46  Hemoglobin (CBC)  8.3  Hematocrit (CBC)  24.6  Platelet Count (CBC)  84  MCV 100  MCH 33.8  MCHC 33.8  RDW  18.2  Neutrophil % 71.6  Lymphocyte % 14.2  Monocyte % 11.7  Eosinophil % 1.8  Basophil % 0.7  Neutrophil #  12.1  Lymphocyte # 2.4  Monocyte #  2.0  Eosinophil # 0.3  Basophil # 0.1 (Result(s) reported on 03 Sep 2013 at 04:46AM.)   Radiology Results: [place]    21-Oct-14 12:17, [place]  [place]   REASON FOR EXAM:    elevated LFT, PT and INR  COMMENTS:       PROCEDURE: [place] - [place]   - Sep 03 2013 12:17PM     RESULT: Ultrasound of the abdomen abdominal ultrasound performed portably   in the critical care unit shows the presence of ascites and bilateral   pleural effusions. The liver appears to be somewhat small with a length   of 11.60 cm and nodular margins with a heterogeneous echotexture. Is   there history of liver disease such as cirrhosis? Portal venous flow   appears normal the common bile duct diameter is 4.7 mm. The right kidney   shows echogenic appearance of the parenchyma measuring 8.99 x 4.63 x 4.17   cm. Between the liver the pancreas there is an uncertain structure which   could represent stomach although peristalsis was not demonstrated. This   is somewhat hypoechoic to the  pancreas and hyperechoic to the liver. The     spleen measures 8.43 x 3.49 x 9.5 to centimeters. The left kidney   measures 9.33 x 3.72 x 3.95 cm. Neither kidney shows a solid or cystic   mass or obstruction.    IMPRESSION:   1. Nodular small liver. Correlate for cirrhosis.  2. Increased echotexture of the renal parenchyma which can be consistent   with chronic renal disease.  3. Ascites with pleural effusions bilaterally.  4. Poor visualization of the pancreas.  5. Nonspecific echotexture between the liver and pancreas which could   represent an abnormal stomach or a collapsed nonfluid filled and   nonperistalsing stomach. There is partial visualization of the aorta. The   portal venous flow an inferior vena cava showed patency.  Dictation Site: 2        Verified By: Sundra Aland, M.D., MD   Assessment/Plan:  Assessment/Plan:  Assessment 1) hematemesis, not recurrent, however paitent with bloody stools today.  no nausea or emesis.  hemodynamically stable.  2) as suspected, patietn US shows evidence of cirrhosis with shrunken nodular liver. , etiology uncertain, though possibility of autoimmune hepatopathy of some type most likely--?PBC.  3) urinary retention,  discussed tieh Dr Posey Pronto, foley was changed without attaining bladder drainage, over 800 ml in bladder.  patietn going down to CT.   Plan 1) awaiting egd.  will staert octreotide drip. continue ppi drip. awaiting ct.  further recs to follow.   Electronic Signatures: Loistine Simas (MD)  (Signed 21-Oct-14 16:17)  Authored: Chief Complaint, VITAL SIGNS/ANCILLARY NOTES, Brief Assessment, Lab Results, Radiology Results, Assessment/Plan   Last Updated: 21-Oct-14 16:17 by Loistine Simas (MD)

## 2015-03-06 NOTE — Consult Note (Signed)
   Comments   Family requested to speak with palliative care. Daughter is tearful, feels that her mother is approaching end of life and is unsure where pt should go from here. Daughter cannot care for pt at home in her present condition. We discussed the options of SNF with hospice vs the Hospice Home. Daughter would like for pt to go to the Hospice Home. Ginny Ward, RN, notified and will see pt.  Dx: Cirrhosis  Secondary Dx: variceal bleed, encephalopathy, Parkinsons, dementia  Electronic Signatures: Lamoine Fredricksen, Harriett SineNancy (MD)  (Signed 22-Oct-14 14:12)  Authored: Palliative Care   Last Updated: 22-Oct-14 14:12 by Clessie Karras, Harriett SineNancy (MD)

## 2015-03-06 NOTE — Consult Note (Signed)
Chief Complaint:  Subjective/Chief Complaint seen for GI  bleed. stable over the day on levophed.  3 small bm today, black.  patient ealisy awakened, denies abdominal pain or nausea, no emesis.   VITAL SIGNS/ANCILLARY NOTES: **Vital Signs.:   20-Oct-14 16:00  Vital Signs Type Routine  Temperature Temperature (F) 98.1  Celsius 36.7  Pulse Pulse 78  Respirations Respirations 20  Systolic BP Systolic BP 920  Diastolic BP (mmHg) Diastolic BP (mmHg) 46  Mean BP 67  Pulse Ox % Pulse Ox % 100  Oxygen Delivery 2L  Pulse Ox Heart Rate 84    17:00  Vital Signs Type Routine  Pulse Pulse 78  Respirations Respirations 23  Systolic BP Systolic BP 100  Diastolic BP (mmHg) Diastolic BP (mmHg) 47  Mean BP 70  Pulse Ox % Pulse Ox % 100  Oxygen Delivery 2L  Pulse Ox Heart Rate 80  *Intake and Output.:   20-Oct-14 12:00  Stool  small black stool    13:00  Stool  small black stool    14:00  Stool  large black stool    17:00  Stool  small black stool   Brief Assessment:  Cardiac Irregular   Respiratory clear BS   Gastrointestinal details normal Soft  Nontender  Nondistended  Bowel sounds normal   Lab Results: Hepatic:  20-Oct-14 03:09   Bilirubin, Total  2.3  Alkaline Phosphatase  586  SGPT (ALT) 27  SGOT (AST)  52  Total Protein, Serum  4.9  Albumin, Serum  1.9  Routine Chem:  20-Oct-14 03:09   Result Comment LABS - This specimen was collected through an   - indwelling catheter or arterial line.  - A minimum of 49ms of blood was wasted prior    - to collecting the sample.  Interpret  - results with caution.  Result(s) reported on 02 Sep 2013 at 03:30AM.  Glucose, Serum  193  BUN  47  Creatinine (comp)  1.90  Sodium, Serum  147  Potassium, Serum  5.3  Chloride, Serum  116  CO2, Serum 27  Calcium (Total), Serum  7.4  Osmolality (calc) 310  eGFR (African American)  27  eGFR (Non-African American)  23 (eGFR values <661mmin/1.73 m2 may be an indication of  chronic kidney disease (CKD). Calculated eGFR is useful in patients with stable renal function. The eGFR calculation will not be reliable in acutely ill patients when serum creatinine is changing rapidly. It is not useful in  patients on dialysis. The eGFR calculation may not be applicable to patients at the low and high extremes of body sizes, pregnant women, and vegetarians.)  Anion Gap  4  Routine Coag:  20-Oct-14 03:09   Prothrombin  15.7  INR 1.2 (INR reference interval applies to patients on anticoagulant therapy. A single INR therapeutic range for coumarins is not optimal for all indications; however, the suggested range for most indications is 2.0 - 3.0. Exceptions to the INR Reference Range may include: Prosthetic heart valves, acute myocardial infarction, prevention of myocardial infarction, and combinations of aspirin and anticoagulant. The need for a higher or lower target INR must be assessed individually. Reference: The Pharmacology and Management of the Vitamin K  antagonists: the seventh ACCP Conference on Antithrombotic and Thrombolytic Therapy. ChFHQRF.7588ept:126 (3suppl): [id number] HCT value >55% may artifactually increase the PT.  In one study,  the increase was an average of 25%. Reference:  "Effect on Routine and Special Coagulation Testing Values of Citrate Anticoagulant Adjustment in  Patients with High HCT Values." American Journal of Clinical Pathology 9694;098:286-751.)  Routine Hem:  20-Oct-14 03:09   Hemoglobin (CBC)  10.1  WBC (CBC)  23.5  RBC (CBC)  3.03  Hematocrit (CBC)  30.0  Platelet Count (CBC)  111  MCV 99  MCH 33.4  MCHC 33.7  RDW  18.1  Neutrophil % 85.1  Lymphocyte % 7.2  Monocyte % 7.3  Eosinophil % 0.0  Basophil % 0.4  Neutrophil #  19.9  Lymphocyte # 1.7  Monocyte #  1.7  Eosinophil # 0.0  Basophil # 0.1    13:18   Hemoglobin (CBC)  8.9 (Result(s) reported on 02 Sep 2013 at 02:04PM.)   Assessment/Plan:   Assessment/Plan:  Assessment 1) hematemesis-stable currently, on pressor, dose being reduced. On ppi, no repeat emesis.   Plan 1) continue current.  tapering pressor as bp allows.  serial hgb, transfuse as needed, EGD when clinically feasible.   Electronic Signatures: Loistine Simas (MD)  (Signed 20-Oct-14 19:55)  Authored: Chief Complaint, VITAL SIGNS/ANCILLARY NOTES, Brief Assessment, Lab Results, Assessment/Plan   Last Updated: 20-Oct-14 19:55 by Loistine Simas (MD)

## 2015-03-06 NOTE — Consult Note (Signed)
Brief Consult Note: Diagnosis: hematemesis.   Patient was seen by consultant.   Recommend further assessment or treatment.   Orders entered.   Discussed with Attending MD.   Comments: Asked to see patient urgently in the ER.  Patient presented via ems from home with multiple episodes of hematemesis that started at about 430pm.  Continued hematemesis resulting in hypotension in the er, rescussitated afte 3 liters ns, one unit ffp and 2 unit prbc.  Patietn placed on levophed at low dosage.  Taken to ccu, patient stable but guarded condition. continue serial hgb, transfuse as needed.  EGD when clinically feasible.  Of note there is some historyy of abnormal lfts, seen for this a number of years ago.  Presedted with elevated AP today.  Inr declined during above 1.2 to 1.5.  Will keep 2 units of prbc ahead, iv ppi. Following.  Electronic Signatures: Barnetta ChapelSkulskie, Latravion Graves (MD)  (Signed 19-Oct-14 22:28)  Authored: Brief Consult Note   Last Updated: 19-Oct-14 22:28 by Barnetta ChapelSkulskie, Keyanah Kozicki (MD)

## 2015-03-06 NOTE — H&P (Signed)
PATIENT NAME:  Sue Lynch, Sue Lynch MR#:  [id number] DATE OF BIRTH:  1922-11-10  DATE OF ADMISSION:  09/01/2013  REFERRING PHYSICIAN: Dr. York Cerise.   FAMILY PHYSICIAN: Dr. Terance Hart.   REASON FOR ADMISSION: Upper GI bleed.   HISTORY OF PRESENT ILLNESS: The patient is an 81 year old female with a history of Parkinson's disease and dementia. Also has a history of benign hypertension and diabetes. Presents to the Emergency Room with recurrent hematemesis. Has been vomiting blood repeatedly. In the Emergency Room, the patient was critically ill appearing and hypotensive. She was vomiting bright red blood and clots in the Emergency Room. She is now admitted for further evaluation. According to her daughter, who has health care power of attorney, the patient is a FULL CODE.   PAST MEDICAL HISTORY:  1. Parkinson's disease.  2. Progressive dementia.  3. Hypothyroidism.  4. Type 2 diabetes.  5. Benign hypertension.  6. Peripheral neuropathy.  7. Status post cholecystectomy.  8. Vitamin D deficiency.   MEDICATIONS:  1. Vitamin D3 1000 units p.o. daily.  2. Synthroid 50 mcg p.o. daily.  3. Zoloft 50 mg p.o. daily.  4. Inderal 40 mg p.o. t.i.d.  5. Cozaar 50 mg p.o. daily.  6. Lasix 40 mg p.o. daily.  7. Klor-Con 20 mEq p.o. b.i.d.  8. Aspirin 81 mg p.o. daily.  9. Citracal D 1 p.o. daily.   ALLERGIES: No known drug allergies.   SOCIAL HISTORY: Negative for alcohol or tobacco abuse.   FAMILY HISTORY: Noncontributory.   REVIEW OF SYSTEMS: Unable to obtain.   PHYSICAL EXAMINATION:  GENERAL: The patient is acutely ill/critically ill appearing and is essentially unresponsive.  VITAL SIGNS: Currently remarkable for a blood pressure of 70/40 with a heart rate of 89 and a respiratory rate of 22. She is afebrile.  HEENT: Normocephalic, atraumatic. Pupils equally round and reactive to light and accommodation. Extraocular movements are intact. Sclerae are nonicteric. Conjunctivae are clear.  Oropharynx is blood filled.  NECK: Supple without JVD. No adenopathy.  LUNGS: Coarse breath sounds with rhonchi bilaterally. No wheezes or rales. No dullness. Respiratory effort is increased.  CARDIAC: Regular rate and rhythm with normal S1 and S2. A 2/6 systolic murmur noted throughout the precordium. No rubs or gallops are present.  ABDOMEN: Soft, nontender, with normoactive bowel sounds. No organomegaly or masses were appreciated. No hernias or bruits were noted.  EXTREMITIES: Without clubbing, cyanosis or edema. Pulses were 1+ bilaterally.  SKIN: Warm and dry without rash or lesions.  NEUROLOGIC: Revealed cranial nerves II through XII grossly intact. Deep tendon reflexes were symmetric. Motor and sensory exams nonfocal.  PSYCHIATRIC: Unable to be performed.   LABORATORY DATA: White count was 13.2 with a hemoglobin of 11.0. Glucose 206 with a BUN of 42, creatinine 1.86, with a potassium of 5.5 and a GFR of 24.   ASSESSMENT:  1. Massive upper gastrointestinal bleed.  2. Acute blood loss anemia.  3. Hypovolemic shock.  4. Hyperglycemia.  5. Advanced dementia.  6. Parkinson's disease.   PLAN: The patient will be admitted to the intensive care unit as a FULL CODE, started on dopamine for blood pressure support. Will transfuse 2 units of packed red blood cells and a unit of fresh frozen plasma at this time. Will consult surgery urgently for IV access. Will consult GI urgently for her GI bleed. Will begin a Protonix drip as well as IV Zofran every 4 hours. Will monitor her blood counts closely. Palliative care consult in the morning. Prognosis is extremely  poor. She will be kept n.p.o. Further treatment and evaluation will depend upon the patient's progress.   TOTAL TIME SPENT ON THIS PATIENT: 55 minutes.   ____________________________ Duane LopeJeffrey D. Judithann SheenSparks, MD jds:gb D: 09/01/2013 20:18:05 ET T: 09/01/2013 20:30:55 ET JOB#: [id number]  cc: Duane LopeJeffrey D. Judithann SheenSparks, MD, <Dictator> Teena Iraniavid M. Terance HartBronstein,  MD Sherlonda Flater Rodena Medin Merisa Julio MD ELECTRONICALLY SIGNED 09/02/2013 8:14

## 2015-03-06 NOTE — Consult Note (Signed)
Chief Complaint:  Subjective/Chief Complaint Patient somnulent, ammonia checked fourn elevated.  I have ordered some lactulose retention enemas.  Hopefully mental status will improve to her baseline.  continue serial hgb, bid today, transfuse as needed.  No active bleeding lesion at end of proceedure this am.  I have discussed with Dr Rayann Heman, who will see the patient tomorrow.   VITAL SIGNS/ANCILLARY NOTES: **Vital Signs.:   22-Oct-14 13:00  Pulse Pulse 80  Respirations Respirations 16  Systolic BP Systolic BP 128  Diastolic BP (mmHg) Diastolic BP (mmHg) 44  Mean BP 63  Pulse Ox % Pulse Ox % 97  Oxygen Delivery 2L  Pulse Ox Heart Rate 80  *Intake and Output.:   22-Oct-14 02:00  Stool  small unformed maroon    03:00  Stool  small unformed maroon colored stool   Brief Assessment:  Cardiac Regular   Respiratory clear BS   Gastrointestinal details normal Bowel sounds normal  apparently non-tender, mild generalized distension.   Lab Results: Routine Chem:  22-Oct-14 03:40   Glucose, Serum  153  BUN  54  Creatinine (comp)  2.04  Sodium, Serum 141  Potassium, Serum 4.5  Chloride, Serum  111  CO2, Serum 22  Calcium (Total), Serum  7.7  Anion Gap 8  Osmolality (calc) 299  eGFR (African American)  24  eGFR (Non-African American)  21 (eGFR values <74m/min/1.73 m2 may be an indication of chronic kidney disease (CKD). Calculated eGFR is useful in patients with stable renal function. The eGFR calculation will not be reliable in acutely ill patients when serum creatinine is changing rapidly. It is not useful in  patients on dialysis. The eGFR calculation may not be applicable to patients at the low and high extremes of body sizes, pregnant women, and vegetarians.)    12:10   Ammonia, Plasma  205 (Result(s) reported on 04 Sep 2013 at 01:40PM.)  Routine Hem:  22-Oct-14 03:40   WBC (CBC)  15.5  RBC (CBC)  2.35  Hemoglobin (CBC)  8.1  Hematocrit (CBC)  23.8  Platelet Count  (CBC)  107  MCV  101  MCH  34.4  MCHC 34.1  RDW  18.4  Neutrophil % 74.9  Lymphocyte % 11.8  Monocyte % 10.0  Eosinophil % 2.2  Basophil % 1.1  Neutrophil #  11.6  Lymphocyte # 1.8  Monocyte #  1.6  Eosinophil # 0.3  Basophil #  0.2 (Result(s) reported on 04 Sep 2013 at 04:31AM.)   Assessment/Plan:  Assessment/Plan:  Assessment as above.   Electronic Signatures: SLoistine Simas(MD)  (Signed 22-Oct-14 14:47)  Authored: Chief Complaint, VITAL SIGNS/ANCILLARY NOTES, Brief Assessment, Lab Results, Assessment/Plan   Last Updated: 22-Oct-14 14:47 by SLoistine Simas(MD)
